# Patient Record
Sex: Female | Born: 1940 | Race: White | Hispanic: No | Marital: Married | Smoking: Never smoker
Health system: Southern US, Community
[De-identification: ages and names within clinical notes are randomized; demographics above are authoritative.]

## PROBLEM LIST (undated history)

## (undated) DIAGNOSIS — I890 Lymphedema, not elsewhere classified: Secondary | ICD-10-CM

## (undated) DIAGNOSIS — C50919 Malignant neoplasm of unspecified site of unspecified female breast: Secondary | ICD-10-CM

## (undated) HISTORY — PX: ABDOMINAL HYSTERECTOMY: SHX81

## (undated) HISTORY — PX: MASTECTOMY: SHX3

---

## 2015-05-06 ENCOUNTER — Encounter (HOSPITAL_COMMUNITY): Payer: Self-pay

## 2015-05-06 ENCOUNTER — Emergency Department (HOSPITAL_COMMUNITY): Payer: Medicare Other

## 2015-05-06 ENCOUNTER — Inpatient Hospital Stay (HOSPITAL_COMMUNITY)
Admission: EM | Admit: 2015-05-06 | Discharge: 2015-05-10 | DRG: 563 | Disposition: A | Discharge status: Home / Self Care | Payer: Medicare Other | Attending: Orthopedic Surgery | Admitting: Orthopedic Surgery

## 2015-05-06 ENCOUNTER — Observation Stay (HOSPITAL_COMMUNITY): Payer: Medicare Other

## 2015-05-06 DIAGNOSIS — S4290XA Fracture of unspecified shoulder girdle, part unspecified, initial encounter for closed fracture: Secondary | ICD-10-CM | POA: Diagnosis present

## 2015-05-06 DIAGNOSIS — S42201A Unspecified fracture of upper end of right humerus, initial encounter for closed fracture: Secondary | ICD-10-CM

## 2015-05-06 DIAGNOSIS — W1830XA Fall on same level, unspecified, initial encounter: Secondary | ICD-10-CM | POA: Diagnosis present

## 2015-05-06 DIAGNOSIS — Z01811 Encounter for preprocedural respiratory examination: Secondary | ICD-10-CM

## 2015-05-06 DIAGNOSIS — Z853 Personal history of malignant neoplasm of breast: Secondary | ICD-10-CM

## 2015-05-06 DIAGNOSIS — S42209A Unspecified fracture of upper end of unspecified humerus, initial encounter for closed fracture: Secondary | ICD-10-CM | POA: Diagnosis present

## 2015-05-06 DIAGNOSIS — Z9010 Acquired absence of unspecified breast and nipple: Secondary | ICD-10-CM

## 2015-05-06 DIAGNOSIS — M25521 Pain in right elbow: Secondary | ICD-10-CM

## 2015-05-06 DIAGNOSIS — S42291A Other displaced fracture of upper end of right humerus, initial encounter for closed fracture: Secondary | ICD-10-CM | POA: Diagnosis not present

## 2015-05-06 DIAGNOSIS — M25511 Pain in right shoulder: Secondary | ICD-10-CM | POA: Diagnosis not present

## 2015-05-06 HISTORY — DX: Lymphedema, not elsewhere classified: I89.0

## 2015-05-06 HISTORY — DX: Malignant neoplasm of unspecified site of unspecified female breast: C50.919

## 2015-05-06 LAB — BASIC METABOLIC PANEL
ANION GAP: 11 (ref 5–15)
BUN: 13 mg/dL (ref 6–20)
CALCIUM: 7.8 mg/dL — AB (ref 8.9–10.3)
CO2: 21 mmol/L — AB (ref 22–32)
CREATININE: 0.62 mg/dL (ref 0.44–1.00)
Chloride: 103 mmol/L (ref 101–111)
GFR calc Af Amer: >60 mL/min (ref >60)
GLUCOSE: 132 mg/dL — AB (ref 65–99)
Potassium: 3.3 mmol/L — ABNORMAL LOW (ref 3.5–5.1)
Sodium: 135 mmol/L (ref 135–145)

## 2015-05-06 LAB — CBC WITH DIFFERENTIAL/PLATELET
BASOS ABS: 0 10*3/uL (ref 0.0–0.1)
BASOS PCT: 0 %
EOS ABS: 0 10*3/uL (ref 0.0–0.7)
EOS PCT: 0 %
HCT: 36.2 % (ref 36.0–46.0)
Hemoglobin: 11.8 g/dL — ABNORMAL LOW (ref 12.0–15.0)
Lymphocytes Relative: 7 %
Lymphs Abs: 1 10*3/uL (ref 0.7–4.0)
MCH: 30.1 pg (ref 26.0–34.0)
MCHC: 32.6 g/dL (ref 30.0–36.0)
MCV: 92.3 fL (ref 78.0–100.0)
MONO ABS: 0.9 10*3/uL (ref 0.1–1.0)
MONOS PCT: 6 %
Neutro Abs: 12.9 10*3/uL — ABNORMAL HIGH (ref 1.7–7.7)
Neutrophils Relative %: 87 %
PLATELETS: 200 10*3/uL (ref 150–400)
RBC: 3.92 MIL/uL (ref 3.87–5.11)
RDW: 13 % (ref 11.5–15.5)
WBC: 14.9 10*3/uL — ABNORMAL HIGH (ref 4.0–10.5)

## 2015-05-06 MED ORDER — ZOLPIDEM TARTRATE 10 MG PO TABS: 10.0000 mg | ORAL_TABLET | Freq: Every evening | ORAL | Status: DC | PRN

## 2015-05-06 MED ORDER — METHOCARBAMOL 500 MG PO TABS
500.0000 mg | ORAL_TABLET | Freq: Four times a day (QID) | ORAL | Status: DC | PRN
  Administered 2015-05-06 – 2015-05-10 (×12): 500 mg via ORAL
  Filled 2015-05-06 (×12): qty 1

## 2015-05-06 MED ORDER — ONDANSETRON 4 MG PO TBDP
4.0000 mg | ORAL_TABLET | Freq: Four times a day (QID) | ORAL | Status: DC | PRN
Start: 2015-05-06 — End: 2015-05-10

## 2015-05-06 MED ORDER — DIAZEPAM 2 MG PO TABS
2.0000 mg | ORAL_TABLET | Freq: Once | ORAL | Status: AC
  Administered 2015-05-06: 2 mg via ORAL
  Filled 2015-05-06: qty 1

## 2015-05-06 MED ORDER — ONDANSETRON HCL 4 MG/2ML IJ SOLN
4.0000 mg | Freq: Four times a day (QID) | INTRAMUSCULAR | Status: DC | PRN
  Administered 2015-05-06 – 2015-05-07 (×4): 4 mg via INTRAVENOUS
  Filled 2015-05-06 (×4): qty 2

## 2015-05-06 MED ORDER — HYDROMORPHONE HCL 1 MG/ML IJ SOLN
1.0000 mg | Freq: Once | INTRAMUSCULAR | Status: AC
Start: 2015-05-06 — End: 2015-05-06
  Administered 2015-05-06: 1 mg via INTRAVENOUS
  Filled 2015-05-06: qty 1

## 2015-05-06 MED ORDER — ONDANSETRON HCL 4 MG/2ML IJ SOLN
4.0000 mg | Freq: Once | INTRAMUSCULAR | Status: AC
  Administered 2015-05-06: 4 mg via INTRAVENOUS
  Filled 2015-05-06: qty 2

## 2015-05-06 MED ORDER — POLYETHYLENE GLYCOL 3350 17 G PO PACK
17.0000 g | PACK | Freq: Every day | ORAL | Status: DC | PRN
  Administered 2015-05-07 – 2015-05-10 (×4): 17 g via ORAL
  Filled 2015-05-06 (×5): qty 1

## 2015-05-06 MED ORDER — HYDROMORPHONE HCL 1 MG/ML IJ SOLN
1.0000 mg | INTRAMUSCULAR | Status: DC | PRN
  Administered 2015-05-07 (×5): 1 mg via INTRAVENOUS
  Filled 2015-05-06 (×5): qty 1

## 2015-05-06 MED ORDER — HYDROMORPHONE HCL 1 MG/ML IJ SOLN
1.0000 mg | Freq: Once | INTRAMUSCULAR | Status: AC
  Administered 2015-05-06: 1 mg via INTRAVENOUS
  Filled 2015-05-06: qty 1

## 2015-05-06 NOTE — ED Notes (Signed)
EMS gave 8mg  of zofran and 126mcg of fentanyl in route

## 2015-05-06 NOTE — ED Notes (Signed)
Pt vomited shortly after receiving pain medication

## 2015-05-06 NOTE — ED Notes (Signed)
Pt slipped on her rug in the kitchen and fell on her right arm, no LOC, she only complains of right arm pain, pt has lymphedema in that arm, no complaints of neck or back pain.

## 2015-05-06 NOTE — ED Notes (Signed)
Bed: [id number] Expected date:  Expected time:  Means of arrival:  Comments: Ems-fall 61f

## 2015-05-06 NOTE — ED Provider Notes (Signed)
CSN: [id number]     Arrival date & time 05/06/15  1918 History   First MD Initiated Contact with Patient 05/06/15 1935     Chief Complaint  Patient presents with  . Fall  . Arm Pain     (Consider location/radiation/quality/duration/timing/severity/associated sxs/prior Treatment) Patient is a 75 y.o. female presenting with fall and arm pain. The history is provided by the patient.  Fall Pertinent negatives include no chest pain, no abdominal pain, no headaches and no shortness of breath.  Arm Pain This is a new problem. The current episode started less than 1 hour ago. The problem occurs constantly. The problem has not changed since onset.Pertinent negatives include no chest pain, no abdominal pain, no headaches and no shortness of breath. Nothing aggravates the symptoms. Nothing relieves the symptoms. She has tried nothing for the symptoms. The treatment provided no relief.   75 yo F with a chief complaints of right arm pain. The patient was walking and tripped over some bedroom slippers and landed on her outstretched right arm. Pain to the upper arm. Patient denies head injury denies loss consciousness. States it was so painful that she had trouble getting off the ground. Denies other injury.   Past Medical History  Diagnosis Date  . Lymphedema   . Breast cancer [facility])    Past Surgical History  Procedure Laterality Date  . Mastectomy    . Abdominal hysterectomy     History reviewed. No pertinent family history. Social History  Substance Use Topics  . Smoking status: Never Smoker   . Smokeless tobacco: None  . Alcohol Use: No   OB History    No data available     Review of Systems  Constitutional: Negative for fever and chills.  HENT: Negative for congestion and rhinorrhea.   Eyes: Negative for redness and visual disturbance.  Respiratory: Negative for shortness of breath and wheezing.   Cardiovascular: Negative for chest pain and palpitations.  Gastrointestinal:  Negative for nausea, vomiting and abdominal pain.  Genitourinary: Negative for dysuria and urgency.  Musculoskeletal: Positive for myalgias and arthralgias.  Skin: Negative for pallor and wound.  Neurological: Negative for dizziness and headaches.      Allergies  Codeine; Levaquin; and Percocet  Home Medications   Prior to Admission medications   Medication Sig Start Date End Date Taking? Authorizing Provider  calcium carbonate 1250 MG capsule Take 1,250 mg by mouth daily.   Yes Historical Provider, MD  cholecalciferol (VITAMIN D) 1000 units tablet Take 1,000 Units by mouth daily.   Yes Historical Provider, MD  pseudoephedrine (SUDAFED) 30 MG tablet Take 60 mg by mouth every 4 (four) hours as needed for congestion.   Yes Historical Provider, MD   BP 170/83 mmHg  Pulse 66  Temp(Src) 97.4 F (36.3 C)  Resp 18  SpO2 97% Physical Exam  Constitutional: She is oriented to person, place, and time. She appears well-developed and well-nourished. No distress.  HENT:  Head: Normocephalic and atraumatic.  Eyes: EOM are normal. Pupils are equal, round, and reactive to light.  Neck: Normal range of motion. Neck supple.  Cardiovascular: Normal rate and regular rhythm.  Exam reveals no gallop and no friction rub.   No murmur heard. Pulmonary/Chest: Effort normal. She has no wheezes. She has no rales.  Abdominal: Soft. She exhibits no distension. There is no tenderness. There is no rebound and no guarding.  Musculoskeletal: She exhibits edema and tenderness.  Tender to palpation about the medial aspect of the upper  arm. Patient with mild deformity. Pulse motor and sensation intact distally. No noted clavicular pain. No pain to the chest abdomen or pelvis. No back pain.  Neurological: She is alert and oriented to person, place, and time.  Skin: Skin is warm and dry. She is not diaphoretic.  Psychiatric: She has a normal mood and affect. Her behavior is normal.  Nursing note and vitals  reviewed.   ED Course  Procedures (including critical care time) Labs Review Labs Reviewed  BASIC METABOLIC PANEL  CBC WITH DIFFERENTIAL/PLATELET    Imaging Review Dg Shoulder Right  05/06/2015  CLINICAL DATA:  Status post fall today with a right shoulder injury. Pain. Initial encounter. EXAM: RIGHT SHOULDER - 2+ VIEW COMPARISON:  None. FINDINGS: The patient has an acute fracture of the proximal humerus extending from the humeral head and greater tuberosity in a spiral orientation through the diaphysis 11 cm below the top of the humeral head. There is no dislocation. The acromioclavicular joint is intact. Surgical clips in the axilla are noted. IMPRESSION: Head and proximal diaphyseal fracture of the right humerus as described above. Electronically Signed   By: Inge Rise M.D.   On: 05/06/2015 20:34   Dg Elbow 2 Views Right  05/06/2015  CLINICAL DATA:  Pain from biceps elbow after fall today at home EXAM: RIGHT ELBOW - 2 VIEW COMPARISON:  None. FINDINGS: AP and lateral views of the elbow limited by suboptimal positioning. No obvious fracture or dislocation. IMPRESSION: Grossly negative but suboptimal positioning causing limited evaluation. Electronically Signed   By: Skipper Cliche M.D.   On: 05/06/2015 20:34   Dg Humerus Right  05/06/2015  CLINICAL DATA:  75 year old female with right arm pain following fall today. Initial encounter. EXAM: RIGHT HUMERUS - 2+ VIEW COMPARISON:  None. FINDINGS: An oblique fracture involving the humeral head, neck and proximal diaphysis is noted with 5 mm posterior/lateral displacement. There is no evidence of subluxation or dislocation. No other bony abnormalities are present. IMPRESSION: Oblique fracture involving the humeral head, neck and proximal diaphysis with 5 mm displacement. Electronically Signed   By: Margarette Canada M.D.   On: 05/06/2015 20:35   I have personally reviewed and evaluated these images and lab results as part of my medical  decision-making.   EKG Interpretation None      MDM   Final diagnoses:  Fracture, humerus, proximal, right, closed, initial encounter    75 yo F with a chief complaint of right arm pain. Patient with deformity to the humeral area. Suspect humeral fracture. We'll obtain plain films.  Patient found to have a proximal humerus fracture with significant displacement. This is discussed with orthopedics they'll admit her for pain control.  The patients results and plan were reviewed and discussed.   Any x-rays performed were independently reviewed by myself.   Differential diagnosis were considered with the presenting HPI.  Medications  HYDROmorphone (DILAUDID) injection 1 mg (not administered)  methocarbamol (ROBAXIN) tablet 500 mg (not administered)  polyethylene glycol (MIRALAX / GLYCOLAX) packet 17 g (not administered)  ondansetron (ZOFRAN-ODT) disintegrating tablet 4 mg (not administered)    Or  ondansetron (ZOFRAN) injection 4 mg (not administered)  HYDROmorphone (DILAUDID) injection 1 mg (1 mg Intravenous Given 05/06/15 1951)  ondansetron (ZOFRAN) injection 4 mg (4 mg Intravenous Given 05/06/15 1952)  HYDROmorphone (DILAUDID) injection 1 mg (1 mg Intravenous Given 05/06/15 2104)  diazepam (VALIUM) tablet 2 mg (2 mg Oral Given 05/06/15 2104)    Filed Vitals:   05/06/15 1932 05/06/15  2137  BP: 161/68 170/83  Pulse: 62 66  Temp: 97.4 F (36.3 C)   Resp: 18 18  SpO2: 97% 97%    Final diagnoses:  Fracture, humerus, proximal, right, closed, initial encounter    Admission/ observation were discussed with the admitting physician, patient and/or family and they are comfortable with the plan.    Deno Etienne, DO 05/06/15 2202

## 2015-05-06 NOTE — H&P (Signed)
Christina Haney is an 75 y.o. female.    Chief Complaint: right shoulder pain  HPI:  75 y/o female with a ground level fall tonight injuring right shoulder/arm. C/o severe pain to right shoulder and arm. Denies any other injuries. No LOC, chest pain or syncope. Denies any previous problems with right shoulder in the past. Denies any numbness or tingling distally. Otherwise doing fair  PCP:  No primary care provider on file.  PMH: Past Medical History  Diagnosis Date  . Lymphedema   . Breast cancer (HCC)     PSH: Past Surgical History  Procedure Laterality Date  . Mastectomy    . Abdominal hysterectomy      Social History:  reports that she has never smoked. She does not have any smokeless tobacco history on file. She reports that she does not drink alcohol. Her drug history is not on file.  Allergies:  Allergies  Allergen Reactions  . Codeine Nausea And Vomiting  . Levaquin [Levofloxacin] Nausea And Vomiting  . Percocet [Oxycodone-Acetaminophen] Nausea And Vomiting    Medications: No current facility-administered medications for this encounter.   Current Outpatient Prescriptions  Medication Sig Dispense Refill  . calcium carbonate 1250 MG capsule Take 1,250 mg by mouth daily.    . cholecalciferol (VITAMIN D) 1000 units tablet Take 1,000 Units by mouth daily.    . pseudoephedrine (SUDAFED) 30 MG tablet Take 60 mg by mouth every 4 (four) hours as needed for congestion.      No results found for this or any previous visit (from the past 48 hour(s)). Dg Shoulder Right  05/06/2015  CLINICAL DATA:  Status post fall today with a right shoulder injury. Pain. Initial encounter. EXAM: RIGHT SHOULDER - 2+ VIEW COMPARISON:  None. FINDINGS: The patient has an acute fracture of the proximal humerus extending from the humeral head and greater tuberosity in a spiral orientation through the diaphysis 11 cm below the top of the humeral head. There is no dislocation. The acromioclavicular  joint is intact. Surgical clips in the axilla are noted. IMPRESSION: Head and proximal diaphyseal fracture of the right humerus as described above. Electronically Signed   By: Inge Rise M.D.   On: 05/06/2015 20:34   Dg Elbow 2 Views Right  05/06/2015  CLINICAL DATA:  Pain from biceps elbow after fall today at home EXAM: RIGHT ELBOW - 2 VIEW COMPARISON:  None. FINDINGS: AP and lateral views of the elbow limited by suboptimal positioning. No obvious fracture or dislocation. IMPRESSION: Grossly negative but suboptimal positioning causing limited evaluation. Electronically Signed   By: Skipper Cliche M.D.   On: 05/06/2015 20:34   Dg Humerus Right  05/06/2015  CLINICAL DATA:  75 year old female with right arm pain following fall today. Initial encounter. EXAM: RIGHT HUMERUS - 2+ VIEW COMPARISON:  None. FINDINGS: An oblique fracture involving the humeral head, neck and proximal diaphysis is noted with 5 mm posterior/lateral displacement. There is no evidence of subluxation or dislocation. No other bony abnormalities are present. IMPRESSION: Oblique fracture involving the humeral head, neck and proximal diaphysis with 5 mm displacement. Electronically Signed   By: Margarette Canada M.D.   On: 05/06/2015 20:35    ROS: ROS Here from out of town Inability to move right upper extremity currently due to pain Otherwise ROS negative  Physical Exam: Alert and appropriate 75 y/o female in moderate pain Cervical spine with full rom, no tenderness Left upper extremity full rom no tenderness Pelvis stable Bilateral lower extremities full rom,  no tenderness or deformity Right upper extremity with mild deformity, edema and inability to move the arm nv intact distally No signs of open injury Physical Exam   Assessment/Plan Assessment: right proximal humerus fracture with displacement  Plan: Plan to admit at least overnight for pain management and determine when surgery would be appropriate Pt lives 6  hours away and would not recommend travel at this point Pain management  Will review CT scan and discuss results with patient in the morning  Activity as tolerated with no use of right upper extremity

## 2015-05-07 ENCOUNTER — Observation Stay (HOSPITAL_COMMUNITY): Payer: Medicare Other

## 2015-05-07 MED ORDER — HYDROMORPHONE HCL 2 MG PO TABS
2.0000 mg | ORAL_TABLET | ORAL | Status: DC | PRN
  Administered 2015-05-07 – 2015-05-08 (×5): 2 mg via ORAL
  Filled 2015-05-07 (×6): qty 1

## 2015-05-07 MED ORDER — DIPHENHYDRAMINE HCL 50 MG/ML IJ SOLN: 25.0000 mg | Freq: Four times a day (QID) | INTRAMUSCULAR | Status: DC | PRN

## 2015-05-07 MED ORDER — ZOLPIDEM TARTRATE 5 MG PO TABS
5.0000 mg | ORAL_TABLET | Freq: Every evening | ORAL | Status: DC | PRN
  Administered 2015-05-07 – 2015-05-09 (×4): 5 mg via ORAL
  Filled 2015-05-07 (×4): qty 1

## 2015-05-07 MED ORDER — DIPHENHYDRAMINE HCL 25 MG PO CAPS
25.0000 mg | ORAL_CAPSULE | ORAL | Status: DC | PRN
  Administered 2015-05-07 – 2015-05-08 (×5): 25 mg via ORAL
  Filled 2015-05-07 (×5): qty 1

## 2015-05-07 MED ORDER — ONDANSETRON HCL 4 MG/2ML IJ SOLN
4.0000 mg | Freq: Four times a day (QID) | INTRAMUSCULAR | Status: DC | PRN
Start: 2015-05-07 — End: 2015-05-07

## 2015-05-08 MED ORDER — HYDROMORPHONE HCL 2 MG PO TABS
2.0000 mg | ORAL_TABLET | ORAL | Status: DC | PRN
  Administered 2015-05-08 – 2015-05-10 (×19): 2 mg via ORAL
  Filled 2015-05-08 (×18): qty 1

## 2015-05-08 NOTE — Care Management Obs Status (Signed)
[place] NOTIFICATION   Patient Details  Name: Christina Haney MRN: [id number] Date of Birth: February 15, 1941   Medicare Observation Status Notification Given:  Yes    Guadalupe Maple, RN 05/08/2015, 2:39 PM

## 2015-05-08 NOTE — Progress Notes (Addendum)
Orthopedics Progress Note  Subjective: Patient reports improvement on itching from the oral meds  Objective:  Filed Vitals:   05/07/15 2146 05/08/15 0556  BP: 147/58 144/74  Pulse: 80 82  Temp: 98.3 F (36.8 C) 98.5 F (36.9 C)  Resp: 16 16    General: Awake and alert  Musculoskeletal: right shoulder in sling and still painful with movement Neurovascularly intact  Lab Results  Component Value Date   WBC 14.9* 05/06/2015   HGB 11.8* 05/06/2015   HCT 36.2 05/06/2015   MCV 92.3 05/06/2015   PLT 200 05/06/2015       Component Value Date/Time   NA 135 05/06/2015 2201   K 3.3* 05/06/2015 2201   CL 103 05/06/2015 2201   CO2 21* 05/06/2015 2201   GLUCOSE 132* 05/06/2015 2201   BUN 13 05/06/2015 2201   CREATININE 0.62 05/06/2015 2201   CALCIUM 7.8* 05/06/2015 2201   GFRNONAA >60 05/06/2015 2201   GFRAA >60 05/06/2015 2201    No results found for: INR, PROTIME  Assessment/Plan:  s/p fall with comminuted and displaced right proximal humerus fracture with acceptable aligment Planning discharge once patient comfortable and able to assist with self care.  She is from the [facility] area and will need follow up in [facility] DVT prophylaxis  Doran Heater. Veverly Fells, MD 05/08/2015 7:24 AM  Patient seen yesterday January 25, at 11:45 am Noted to have significant pain with mobilization and some itching with the IV medicines. Oral Dilaudid ordered  CXR noting no acute process and xrays and CT of the right proximal humerus show an extensively comminuted proximal humerus fracture with shaft extension.  Discussed with the patient and her husband that as long as the fracture alignment remained as it is right now, that non operative care should result in a good outcome.  Will need serial xrays to make sure that the greater tuberosity does not displace any more or the head fall into more valgus.   Netta Cedars MD

## 2015-05-08 NOTE — Care Management Note (Signed)
Case Management Note  Patient Details  Name: Christina Haney MRN: [id number] Date of Birth: 03-17-1941  Subjective/Objective:       Admitted s/p fall with humerus fracture, needing pain control             Action/Plan: Discharge planning, spoke with patient and spouse at bedside. Patient is from [facility], [place] and here visiting family. States she is undecided whether to stay here for a few days/week or return to [facility]. Discussed limitations of transportation. Patient unable to decide if she could make the trip or not. Patient has PCP in [facility] she will f/u with, she is concerned for lymphedema in the affect arm from previous breast cancer. Patient is ambulating independently in room, needs some assistance with getting in and out of bed, husband able to assist. Do not anticipate any d/c needs.   Expected Discharge Date:                  Expected Discharge Plan:  Home/Self Care  In-House Referral:  NA  Discharge planning Services  CM Consult  Post Acute Care Choice:  NA Choice offered to:  NA  DME Arranged:  N/A DME Agency:  NA  HH Arranged:  NA HH Agency:  NA  Status of Service:  Completed, signed off  Medicare Important Message Given:    Date Medicare IM Given:    Medicare IM give by:    Date Additional Medicare IM Given:    Additional Medicare Important Message give by:     If discussed at [place] of Stay Meetings, dates discussed:    Additional Comments:  Guadalupe Maple, RN 05/08/2015, 2:33 PM

## 2015-05-09 DIAGNOSIS — Z853 Personal history of malignant neoplasm of breast: Secondary | ICD-10-CM | POA: Diagnosis not present

## 2015-05-09 DIAGNOSIS — S4290XA Fracture of unspecified shoulder girdle, part unspecified, initial encounter for closed fracture: Secondary | ICD-10-CM | POA: Diagnosis present

## 2015-05-09 DIAGNOSIS — M25511 Pain in right shoulder: Secondary | ICD-10-CM | POA: Diagnosis present

## 2015-05-09 DIAGNOSIS — W1830XA Fall on same level, unspecified, initial encounter: Secondary | ICD-10-CM | POA: Diagnosis present

## 2015-05-09 DIAGNOSIS — S42291A Other displaced fracture of upper end of right humerus, initial encounter for closed fracture: Secondary | ICD-10-CM | POA: Diagnosis present

## 2015-05-09 DIAGNOSIS — Z9010 Acquired absence of unspecified breast and nipple: Secondary | ICD-10-CM | POA: Diagnosis not present

## 2015-05-09 NOTE — Discharge Instructions (Signed)
Strict NWB with the Right UE.  Keep sling on and the arm to your side at all times.    Ice or heat for comfort to the right shoulder.  Follow up this Thursday with Dr Veverly Fells at [place] [phone]

## 2015-05-09 NOTE — Progress Notes (Signed)
Orthopedics Progress Note  Subjective: Patient doing some better this evening Objective:  Filed Vitals:   05/09/15 0537 05/09/15 1400  BP: 138/66 113/52  Pulse: 82 89  Temp: 98.1 F (36.7 C) 98.1 F (36.7 C)  Resp: 16 16    General: Awake and alert  Musculoskeletal: right shoulder immobilized in shoulder sling, wiggles fingers Neurovascularly intact  Lab Results  Component Value Date   WBC 14.9* 05/06/2015   HGB 11.8* 05/06/2015   HCT 36.2 05/06/2015   MCV 92.3 05/06/2015   PLT 200 05/06/2015       Component Value Date/Time   NA 135 05/06/2015 2201   K 3.3* 05/06/2015 2201   CL 103 05/06/2015 2201   CO2 21* 05/06/2015 2201   GLUCOSE 132* 05/06/2015 2201   BUN 13 05/06/2015 2201   CREATININE 0.62 05/06/2015 2201   CALCIUM 7.8* 05/06/2015 2201   GFRNONAA >60 05/06/2015 2201   GFRAA >60 05/06/2015 2201    No results found for: INR, PROTIME  Assessment/Plan: s/p fall with comminuted and displaced right proximal humerus fracture Pt, OT Case Management for home DME Patient from the [place] to follow up with Dr Marlou Sa in her area after a week or so in the [place] area.    Doran Heater. Veverly Fells, MD 05/09/2015 5:23 PM

## 2015-05-10 MED ORDER — BISACODYL 10 MG RE SUPP
10.0000 mg | Freq: Once | RECTAL | Status: AC
  Administered 2015-05-10: 10 mg via RECTAL
  Filled 2015-05-10: qty 1

## 2015-05-10 MED ORDER — METHOCARBAMOL 500 MG PO TABS: 500.0000 mg | ORAL_TABLET | Freq: Four times a day (QID) | ORAL | Status: AC | PRN

## 2015-05-10 MED ORDER — HYDROMORPHONE HCL 2 MG PO TABS: 2.0000 mg | ORAL_TABLET | ORAL | Status: AC | PRN

## 2015-05-10 NOTE — Progress Notes (Signed)
Assessment unchanged. Pt and husband verbalized understanding of dc instructions through teach regarding follow up care with Dr. Veverly Fells with in the next week and when to call doctor if needed before then. Scripts given as provided by MD. Pt saw PT and OT prior to dc home. Sling in place to RT arm and pt verbalizes understanding to not  remove nor change position of arm. Discharged via wc to front entrance to meet awaiting vehicle to carry home. Accompanied by husband and NT.

## 2015-05-10 NOTE — Care Management Important Message (Deleted)
Important Message  Patient Details  Name: Christina Haney MRN: [id number] Date of Birth: Feb 08, 1941   Medicare Important Message Given:  Yes    Erenest Rasher, RN 05/10/2015, 12:18 PM

## 2015-05-10 NOTE — Care Management Note (Signed)
Case Management Note  Patient Details  Name: Christina Haney MRN: [id number] Date of Birth: 13-Feb-1941  Subjective/Objective:     Admitted s/p fall with humerus fracture, needing pain control                 Action/Plan: NCM spoke to pt and husband at bedside. No PT/OT recommended for home. Pt states she will travel back to [facility] after she follows up with Orthopedic in one week.   Expected Discharge Date:  05/10/2015              Expected Discharge Plan:  Home/Self Care  In-House Referral:  NA  Discharge planning Services  CM Consult  Post Acute Care Choice:  NA Choice offered to:  NA  DME Arranged:  N/A DME Agency:  NA  HH Arranged:  NA HH Agency:  NA  Status of Service:  Completed, signed off  Medicare Important Message Given:    Date Medicare IM Given:    Medicare IM give by:    Date Additional Medicare IM Given:    Additional Medicare Important Message give by:     If discussed at [place] of Stay Meetings, dates discussed:    Additional Comments:  Erenest Rasher, RN 05/10/2015, 12:17 PM

## 2015-05-10 NOTE — Progress Notes (Signed)
   Subjective:     Recheck right proximal humerus fracture Pain has improved and pt moving around the room without difficulty Plan for d/c home today with home therapy Denies any new symptoms or issues  Patient reports pain as moderate.  Objective:   VITALS:   Filed Vitals:   05/09/15 2117 05/10/15 0426  BP: 132/64 140/70  Pulse: 87 91  Temp: 98.9 F (37.2 C) 98.1 F (36.7 C)  Resp: 16 16    Right upper extremity in sling nv intact distally No exam today due to known fx  LABS No results for input(s): HGB, HCT, WBC, PLT in the last 72 hours.  No results for input(s): NA, K, BUN, CREATININE, GLUCOSE in the last 72 hours.   Assessment/Plan:    Right proximal humerus fracture Plan to d/c home today with pain medications F/u with local orthopedic in 2 weeks in [facility] Call our office for any other issues while in town     [place], [place], PA-C  05/10/2015, 7:56 AM

## 2015-05-10 NOTE — Evaluation (Signed)
Physical Therapy Evaluation-1x Patient Details Name: Christina Haney MRN: [id number] DOB: July 26, 1940 Today's Date: 05/10/2015   History of Present Illness  75 yo female admitted after having fall at son's home. Sustained R humerus fx. Plan per ortho-non operative/immobilization with sling.   Clinical Impression  On eval, pt was Min guard assist for mobility-walked ~200 feet and ascended/descended 2 steps with use of rail. Slight unsteadiness and anxiety noted. Encouraged pt to exercise caution when mobilizing (take her time/no rushing, assist/supervision when climbing steps, etc). Pt tolerated activity fairly well. Discussed use of cane in community (pt could pick one up at local drugstore/medial supply store if she decides to). Do not anticipate any follow up PT needs. 1x eval. Will sign off.     Follow Up Recommendations No PT follow up    Equipment Recommendations  None recommended by PT (pt may consider purchasing cane for community ambulation)    Recommendations for Other Services OT consult     Precautions / Restrictions Precautions Precautions: Fall Required Braces or Orthoses: Sling Restrictions Weight Bearing Restrictions: Yes RLE Weight Bearing: Non weight bearing      Mobility  Bed Mobility               General bed mobility comments: pt OOB pacing around room  Transfers                 General transfer comment: pt up oob pacing around room  Ambulation/Gait   Ambulation Distance (Feet): 200 Feet Assistive device: None Gait Pattern/deviations: Step-through pattern;Decreased stride length;Drifts right/left     General Gait Details: close guard for safety. Slightly unsteady at times but no LOB. Intermittent "furniture walking" noted  Stairs Stairs: Yes Stairs assistance: Min guard Stair Management: Step to pattern;Forwards Number of Stairs: 2 General stair comments: close guard for safety. VCs safety, technique.   Wheelchair Mobility     Modified Rankin (Stroke Patients Only)       Balance Overall balance assessment: Needs assistance         Standing balance support: During functional activity Standing balance-Leahy Scale: Good                               Pertinent Vitals/Pain Pain Assessment: 0-10 Pain Score: 5  Pain Location: R UE with some movements Pain Descriptors / Indicators: Sore Pain Intervention(s): Monitored during session    Home Living Family/patient expects to be discharged to:: Private residence (son's home) Living Arrangements: Spouse/significant other Available Help at Discharge: Family Type of Home: House Home Access: Stairs to enter   Technical brewer of Steps: 2   Home Equipment: None Additional Comments: Pt is visiting from [place]-plans to stay at son's home for a few days until follow up ortho appt    Prior Function Level of Independence: Independent               Hand Dominance        Extremity/Trunk Assessment   Upper Extremity Assessment: Defer to OT evaluation           Lower Extremity Assessment: Generalized weakness      Cervical / Trunk Assessment: Normal  Communication   Communication: No difficulties  Cognition Arousal/Alertness: Awake/alert Behavior During Therapy: WFL for tasks assessed/performed Overall Cognitive Status: Within Functional Limits for tasks assessed                      General Comments  Exercises        Assessment/Plan    PT Assessment Patent does not need any further PT services  PT Diagnosis     PT Problem List    PT Treatment Interventions     PT Goals (Current goals can be found in the Care Plan section) Acute Rehab PT Goals Patient Stated Goal: no more falls.  PT Goal Formulation: All assessment and education complete, DC therapy    Frequency     Barriers to discharge        Co-evaluation               End of Session   Activity Tolerance: Patient tolerated  treatment well Patient left: with call bell/phone within reach;with family/visitor present           Time: [id number] PT Time Calculation (min) (ACUTE ONLY): 17 min   Charges:   PT Evaluation $PT Eval Moderate Complexity: 1 Procedure     PT G Codes:        Weston Anna, MPT Pager: [phone]

## 2015-05-10 NOTE — Evaluation (Signed)
Occupational Therapy Evaluation Patient Details Name: Christina Haney MRN: [id number] DOB: Aug 02, 1940 Today's Date: 05/10/2015    History of Present Illness 75 yo female admitted after having fall at son's home. Sustained R humerus fx. Plan per ortho-non operative/immobilization with sling.    Clinical Impression   Pt admitted with R humerus fracture. Pt currently with functional limitations due to the deficits listed below (see OT Problem List).  Pt will benefit from skilled OT to increase their safety and independence with ADL and functional mobility for ADL to facilitate discharge to venue listed below.      Follow Up Recommendations  Outpatient OT;Other (comment) (as MD indicates)    Equipment Recommendations  None recommended by OT;Other (comment) (son already purchased 3 n 1)       Precautions / Restrictions Precautions Precautions: Fall Required Braces or Orthoses: Sling Restrictions Weight Bearing Restrictions: Yes RLE Weight Bearing: Non weight bearing      Mobility Bed Mobility Overal bed mobility: Needs Assistance Bed Mobility: Sit to Supine       Sit to supine: Min assist   General bed mobility comments: pt OOB pacing around room  Transfers Overall transfer level: Needs assistance   Transfers: Sit to/from Stand Sit to Stand: Min assist         General transfer comment: educated pt to push up with LUE from arm of chair    Balance Overall balance assessment: Needs assistance         Standing balance support: During functional activity Standing balance-Leahy Scale: Good                              ADL Overall ADL's : Needs assistance/impaired Eating/Feeding: Minimal assistance;Sitting   Grooming: Minimal assistance;Sitting   Upper Body Bathing: Moderate assistance;Cueing for UE precautions;Sitting;Adhering to UE precautions   Lower Body Bathing: Maximal assistance;Sit to/from stand;Cueing for sequencing;Cueing for safety    Upper Body Dressing : Moderate assistance;Cueing for UE precautions;Sitting   Lower Body Dressing: Maximal assistance;Sit to/from stand;Cueing for sequencing   Toilet Transfer: Minimal assistance;BSC;Regular Toilet;Stand-pivot   Toileting- Clothing Manipulation and Hygiene: Sit to/from stand;Cueing for sequencing;Cueing for safety                         Pertinent Vitals/Pain Pain Assessment: 0-10 Pain Score: 9  Pain Location: RUE  Pain Descriptors / Indicators: Sore Pain Intervention(s): Repositioned;Monitored during session     Hand Dominance  right   Extremity/Trunk Assessment Upper Extremity Assessment Upper Extremity Assessment: RUE deficits/detail RUE Deficits / Details: RUe in sling- pt able to move wrist and hand. Per MD pt is allowed to extend elbow but pain is preventing this movement.  pt does have a history of lymphedema. Explained ROM for elbow wrist and hand important for preventing further edema RUE: Unable to fully assess due to immobilization   Lower Extremity Assessment Lower Extremity Assessment: Generalized weakness   Cervical / Trunk Assessment Cervical / Trunk Assessment: Normal   Communication Communication Communication: No difficulties   Cognition Arousal/Alertness: Awake/alert Behavior During Therapy: WFL for tasks assessed/performed Overall Cognitive Status: Within Functional Limits for tasks assessed                     General Comments    Pt needed increased time for all instruction   Exercises       Shoulder Instructions Shoulder Instructions Donning/doffing shirt without moving shoulder: Caregiver  independent with task;Patient able to independently direct caregiver;Moderate assistance Method for sponge bathing under operated UE: Caregiver independent with task;Patient able to independently direct caregiver Donning/doffing sling/immobilizer: Caregiver independent with task;Patient able to independently direct  caregiver;Moderate assistance Correct positioning of sling/immobilizer: Caregiver independent with task;Patient able to independently direct caregiver;Moderate assistance ROM for elbow, wrist and digits of operated UE: Caregiver independent with task;Patient able to independently direct caregiver;Moderate assistance Sling wearing schedule (on at all times/off for ADL's): Caregiver independent with task Proper positioning of operated UE when showering: Caregiver independent with task;Patient able to independently direct caregiver;Moderate assistance Dressing change: Moderate assistance Positioning of UE while sleeping: Caregiver independent with task;Patient able to independently direct caregiver;Moderate assistance    Home Living Family/patient expects to be discharged to:: Private residence (son's home) Living Arrangements: Spouse/significant other Available Help at Discharge: Family Type of Home: House Home Access: Stairs to enter Technical brewer of Steps: 2   Home Layout: One level     Bathroom Shower/Tub: Chief Strategy Officer: None   Additional Comments: Pt is visiting from [place] to stay at son's home for a few days until follow up ortho appt      Prior Functioning/Environment Level of Independence: Independent             OT Diagnosis: Generalized weakness;Ataxia   OT Problem List: Decreased strength;Decreased range of motion;Decreased activity tolerance   OT Treatment/Interventions:      OT Goals(Current goals can be found in the care plan section) Acute Rehab OT Goals Patient Stated Goal: no more falls.   OT Frequency:                End of Session Nurse Communication: Mobility status  Activity Tolerance: Patient tolerated treatment well Patient left: in chair;with call bell/phone within reach;with family/visitor present   Time: 1000-1105 OT Time Calculation (min): 65 min Charges:  OT General Charges $OT Visit: 1  Procedure OT Evaluation $OT Eval Low Complexity: 1 Procedure OT Treatments $Self Care/Home Management : 38-52 mins G-Codes:    Payton Mccallum D 08-Jun-2015, 11:23 AM

## 2015-05-10 NOTE — Discharge Summary (Signed)
Physician Discharge Summary   Patient ID: Christina Haney MRN: [id number] DOB/AGE: [birth year] 73 y.o.  Admit date: 05/06/2015 Discharge date: 05/10/2015  Admission Diagnoses:  Active Problems:   Fracture, humerus, proximal   Fracture, shoulder   Discharge Diagnoses:  Same   Surgeries:  on * No surgery found *   Consultants: PT/OT  Discharged Condition: Stable  Hospital Course: Christina Haney is an 75 y.o. female who was admitted 05/06/2015 with a chief complaint of  Chief Complaint  Patient presents with  . Fall  . Arm Pain  , and found to have a diagnosis of <principal problem not specified>.  They were brought to the operating room on * No surgery found * and underwent the above named procedures.    The patient had an uncomplicated hospital course and was stable for discharge.  Recent vital signs:  Filed Vitals:   05/09/15 2117 05/10/15 0426  BP: 132/64 140/70  Pulse: 87 91  Temp: 98.9 F (37.2 C) 98.1 F (36.7 C)  Resp: 16 16    Recent laboratory studies:  Results for orders placed or performed during the hospital encounter of [id number]  Basic metabolic panel  Result Value Ref Range   Sodium 135 135 - 145 mmol/L   Potassium 3.3 (L) 3.5 - 5.1 mmol/L   Chloride 103 101 - 111 mmol/L   CO2 21 (L) 22 - 32 mmol/L   Glucose, Bld 132 (H) 65 - 99 mg/dL   BUN 13 6 - 20 mg/dL   Creatinine, Ser 0.62 0.44 - 1.00 mg/dL   Calcium 7.8 (L) 8.9 - 10.3 mg/dL   GFR calc non Af Amer >60 >60 mL/min   GFR calc Af Amer >60 >60 mL/min   Anion gap 11 5 - 15  CBC WITH DIFFERENTIAL  Result Value Ref Range   WBC 14.9 (H) 4.0 - 10.5 K/uL   RBC 3.92 3.87 - 5.11 MIL/uL   Hemoglobin 11.8 (L) 12.0 - 15.0 g/dL   HCT 36.2 36.0 - 46.0 %   MCV 92.3 78.0 - 100.0 fL   MCH 30.1 26.0 - 34.0 pg   MCHC 32.6 30.0 - 36.0 g/dL   RDW 13.0 11.5 - 15.5 %   Platelets 200 150 - 400 K/uL   Neutrophils Relative % 87 %   Neutro Abs 12.9 (H) 1.7 - 7.7 K/uL   Lymphocytes Relative 7 %   Lymphs Abs 1.0  0.7 - 4.0 K/uL   Monocytes Relative 6 %   Monocytes Absolute 0.9 0.1 - 1.0 K/uL   Eosinophils Relative 0 %   Eosinophils Absolute 0.0 0.0 - 0.7 K/uL   Basophils Relative 0 %   Basophils Absolute 0.0 0.0 - 0.1 K/uL    Discharge Medications:     Medication List    ASK your doctor about these medications        calcium carbonate 1250 MG capsule  Take 1,250 mg by mouth daily.     cholecalciferol 1000 units tablet  Commonly known as:  VITAMIN D  Take 1,000 Units by mouth daily.     pseudoephedrine 30 MG tablet  Commonly known as:  SUDAFED  Take 60 mg by mouth every 4 (four) hours as needed for congestion.        Diagnostic Studies: Dg Chest 2 View  05/07/2015  CLINICAL DATA:  Preop for right humerus fracture EXAM: CHEST  2 VIEW COMPARISON:  None. FINDINGS: Cardiomediastinal silhouette is unremarkable. Surgical clips are noted in right axilla. No acute infiltrate or  pulmonary edema. There is partially visualized comminuted fracture of the proximal right humerus. IMPRESSION: No active disease. Surgical clips are noted in right axilla. Partially visualized right humeral fracture. Electronically Signed   By: Lahoma Crocker M.D.   On: 05/07/2015 08:56   Dg Shoulder Right  05/06/2015  CLINICAL DATA:  Status post fall today with a right shoulder injury. Pain. Initial encounter. EXAM: RIGHT SHOULDER - 2+ VIEW COMPARISON:  None. FINDINGS: The patient has an acute fracture of the proximal humerus extending from the humeral head and greater tuberosity in a spiral orientation through the diaphysis 11 cm below the top of the humeral head. There is no dislocation. The acromioclavicular joint is intact. Surgical clips in the axilla are noted. IMPRESSION: Head and proximal diaphyseal fracture of the right humerus as described above. Electronically Signed   By: Inge Rise M.D.   On: 05/06/2015 20:34   Dg Elbow 2 Views Right  05/06/2015  CLINICAL DATA:  Pain from biceps elbow after fall today at home  EXAM: RIGHT ELBOW - 2 VIEW COMPARISON:  None. FINDINGS: AP and lateral views of the elbow limited by suboptimal positioning. No obvious fracture or dislocation. IMPRESSION: Grossly negative but suboptimal positioning causing limited evaluation. Electronically Signed   By: Skipper Cliche M.D.   On: 05/06/2015 20:34   Ct Shoulder Right Wo Contrast  05/06/2015  CLINICAL DATA:  Status post fall, with a comminuted fracture of the right humeral head. Initial encounter. EXAM: CT OF THE RIGHT SHOULDER WITHOUT CONTRAST TECHNIQUE: Multidetector CT imaging was performed according to the standard protocol. Multiplanar CT image reconstructions were also generated. COMPARISON:  Right shoulder radiographs performed earlier today at 8:09 p.m. FINDINGS: There appears to be a four-part fracture of the right humeral head and neck, with approximately 1 cm of medial and superior displacement of the lesser tuberosity fragment, approximately 1.5 cm of superior displacement of the greater tuberosity fragment, and at least 1.8 cm lateral displacement of the distal humerus. There is underlying comminution of the humeral neck fracture, with displaced fracture lines extending distally along the proximal humeral diaphysis, and associated butterfly fragments. The greater and lesser tuberosity fragments are relatively close to each other, as both are superiorly displaced. Surrounding soft tissue injury is noted, without a significant focal hematoma. The rotator cuff is not well characterized. Postoperative change is noted at the right axilla. The right acromioclavicular joint is unremarkable in appearance. The visualized portions of the right lung appear grossly clear. The articular surface of the humeral head remains seated at the glenoid fossa. The visualized right clavicle is unremarkable in appearance. IMPRESSION: Apparent 4 part fracture of the right humeral head and neck, with approximately 1 cm medial and superior displacement of the  lesser tuberosity fragment, approximately 1.5 cm of superior displacement of the greater tuberosity fragment, and at least 1.8 cm lateral displacement of the distal humerus. Underlying comminution of the humeral neck fracture, with displaced fracture lines extending distally along the proximal humeral diaphysis. The greater and lesser tuberosity fragments are relatively close to each other, as both are superiorly displaced. Electronically Signed   By: Garald Balding M.D.   On: 05/06/2015 23:07   Dg Humerus Right  05/06/2015  CLINICAL DATA:  75 year old female with right arm pain following fall today. Initial encounter. EXAM: RIGHT HUMERUS - 2+ VIEW COMPARISON:  None. FINDINGS: An oblique fracture involving the humeral head, neck and proximal diaphysis is noted with 5 mm posterior/lateral displacement. There is no evidence of subluxation or  dislocation. No other bony abnormalities are present. IMPRESSION: Oblique fracture involving the humeral head, neck and proximal diaphysis with 5 mm displacement. Electronically Signed   By: Margarette Canada M.D.   On: 05/06/2015 20:35    Disposition: Final discharge disposition not confirmed        Follow-up Information    Follow up with NORRIS,STEVEN R, MD. Call in 1 week.   Specialty:  Orthopedic Surgery   Why:  [phone]   Contact information:   [address] [place] [address] [id number]        Signed: Ventura Bruns 05/10/2015, 7:58 AM

## 2017-01-30 IMAGING — CR DG HUMERUS 2V *R*
3 series · 3 of 3 positions shown · non-contrast
Comparison: None.

CLINICAL DATA: 74-year-old female with right arm pain following
fall today. Initial encounter.

EXAM:
RIGHT HUMERUS - 2+ VIEW

[x humerus ap right]
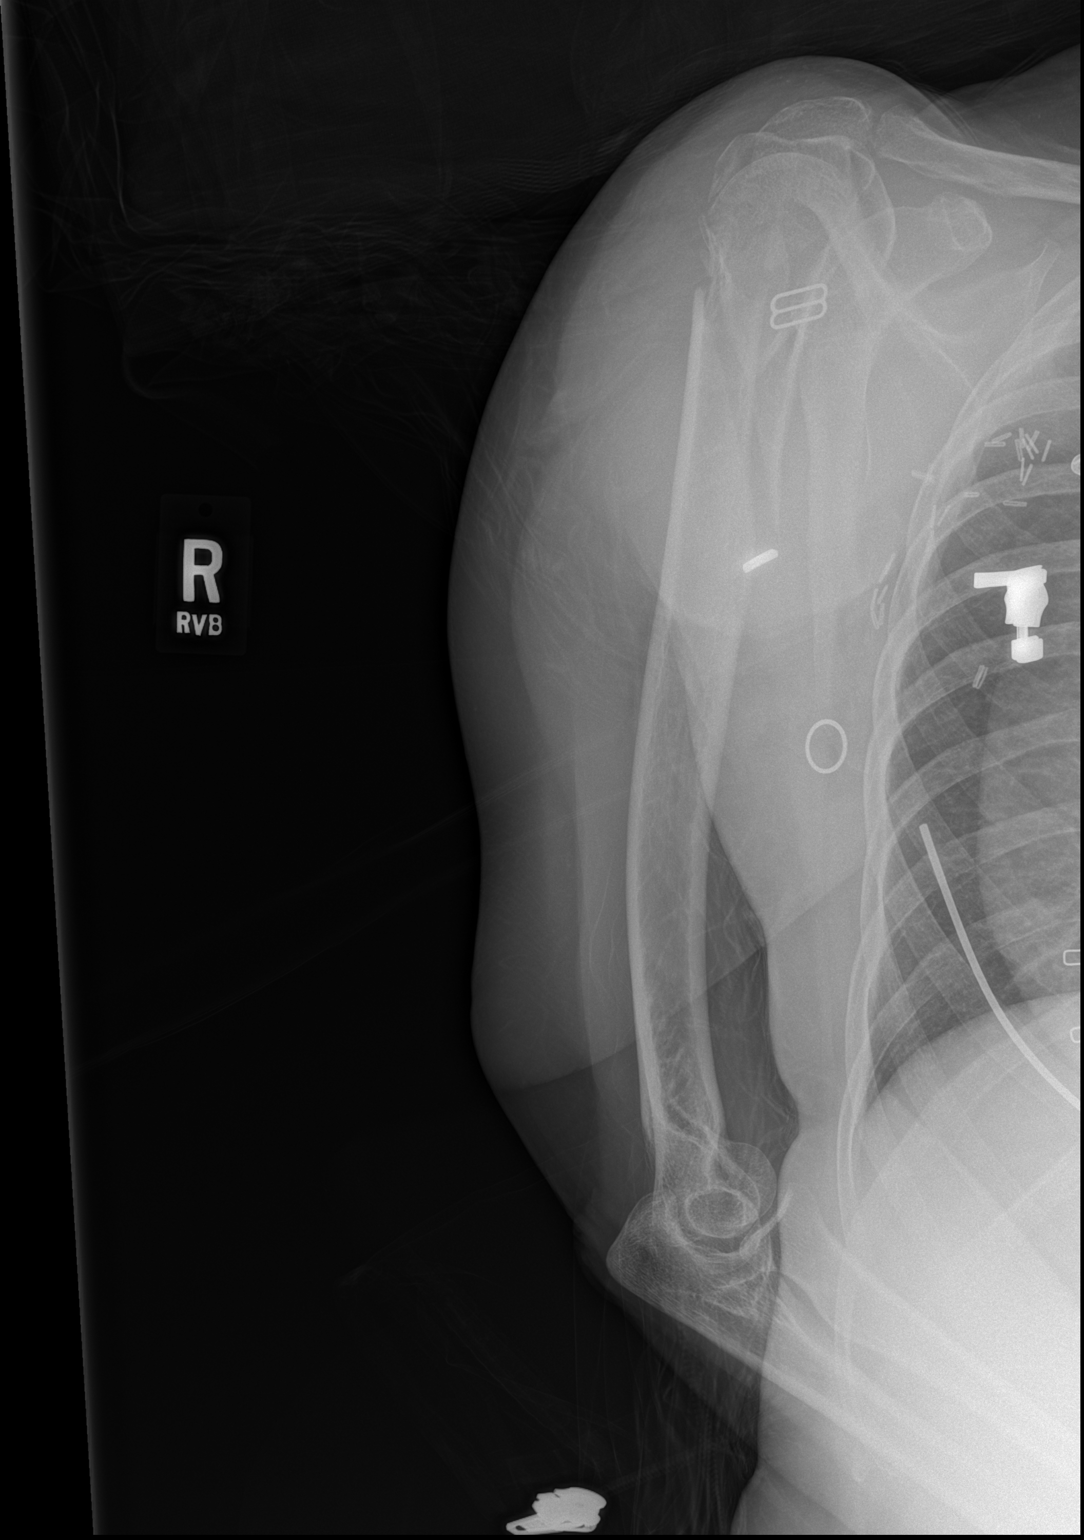

[x humerus lat right (1 of 2)]
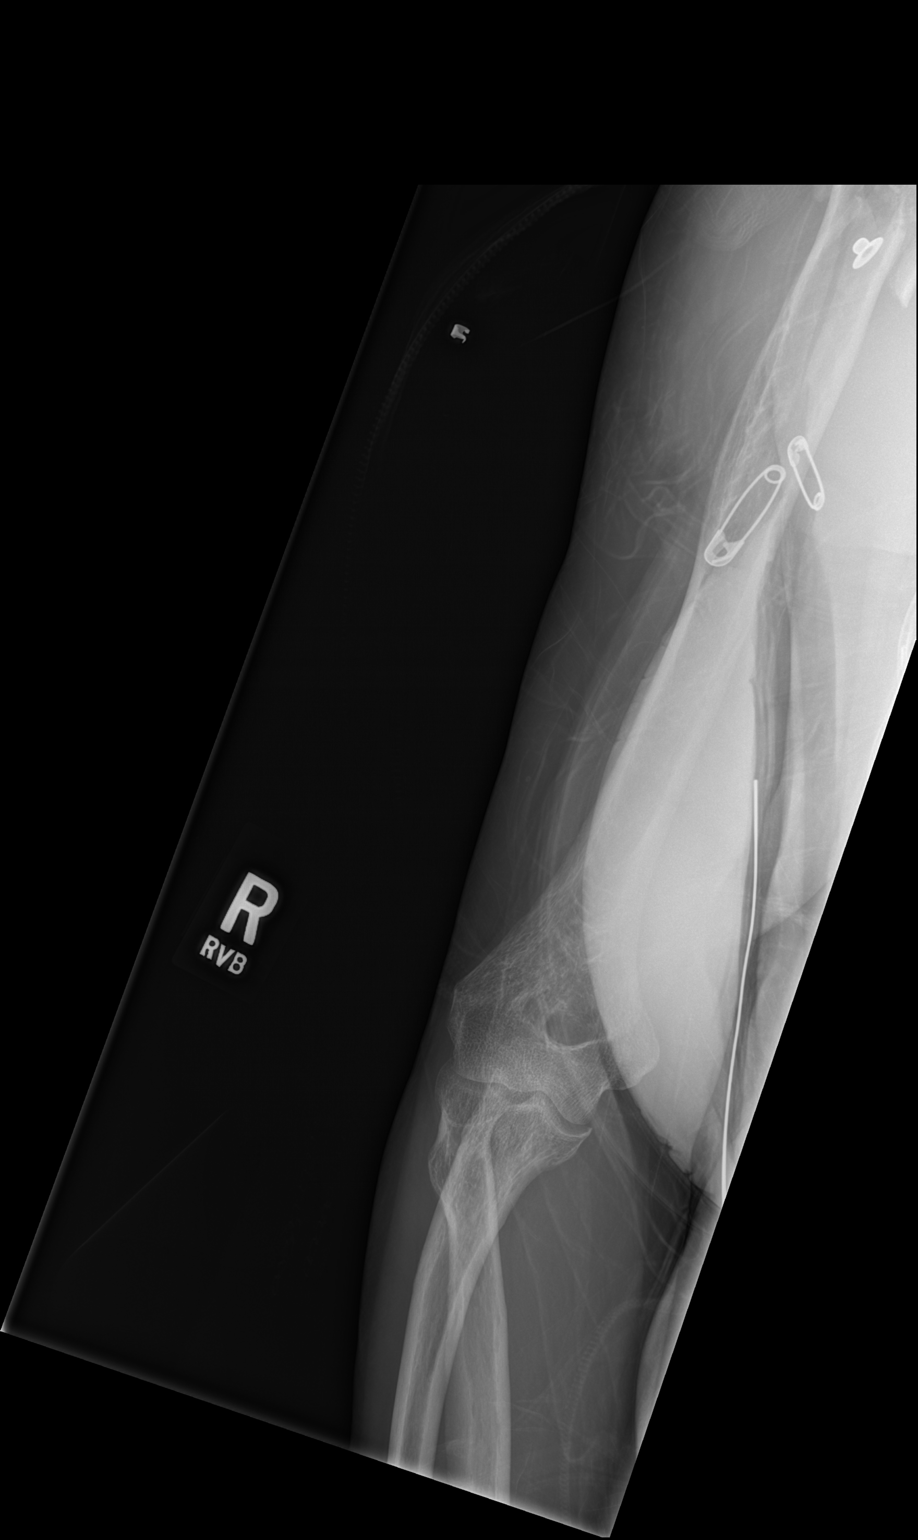

[x humerus lat right (2 of 2)]
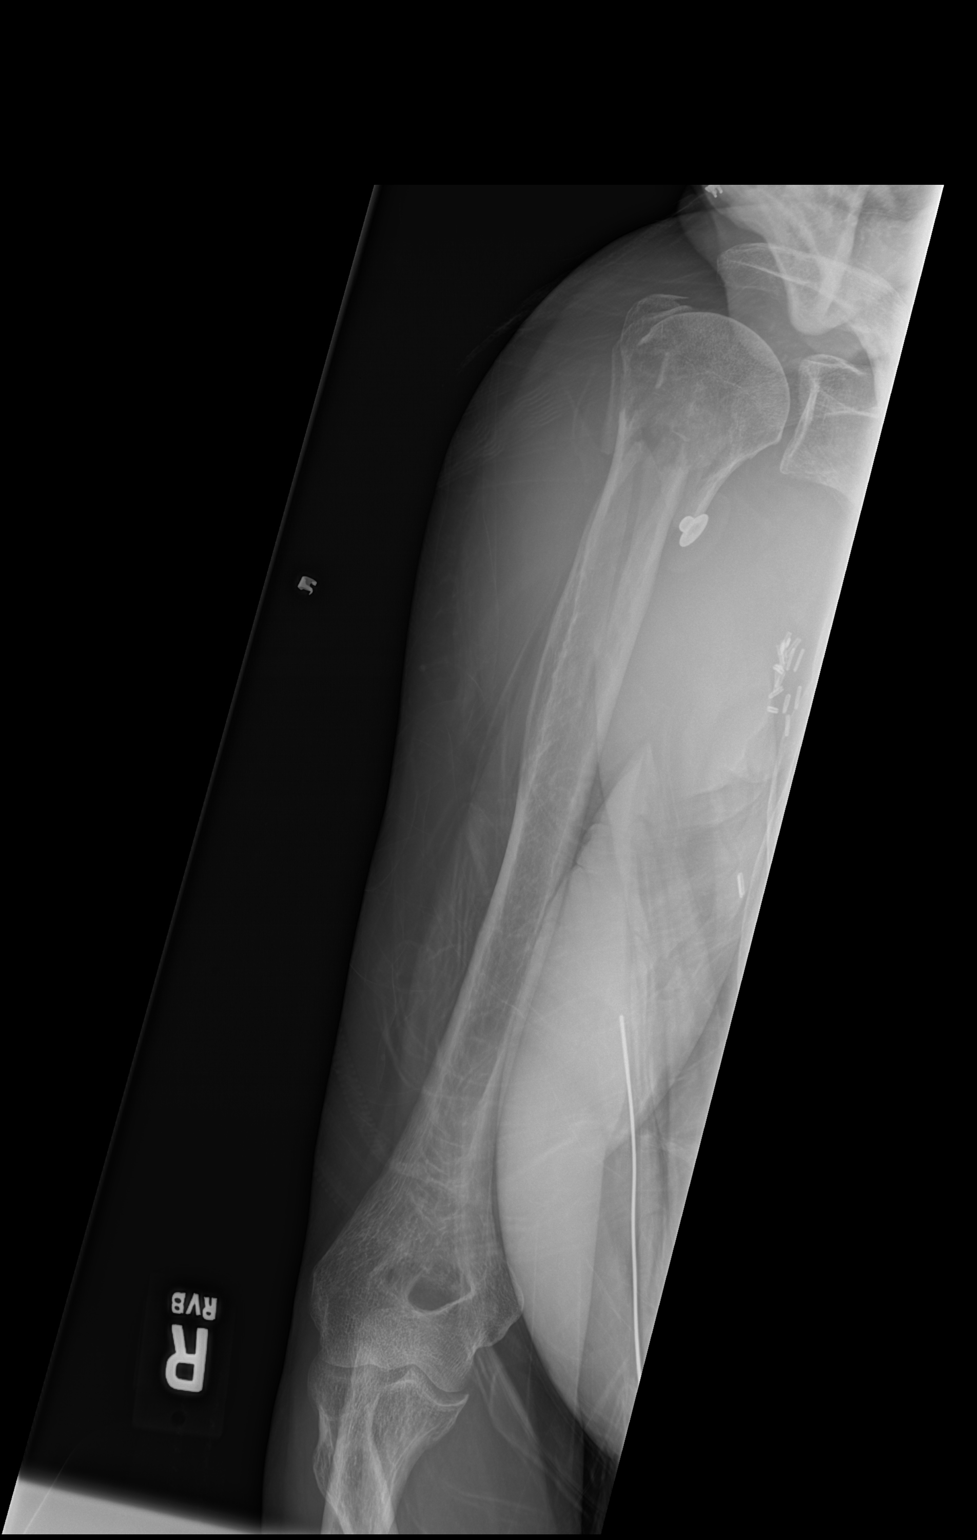

[3 of 3 positions shown; findings below may reference images not displayed]

FINDINGS: An oblique fracture involving the humeral head, neck and proximal
diaphysis is noted with 5 mm posterior/lateral displacement.

There is no evidence of subluxation or dislocation.

No other bony abnormalities are present.
IMPRESSION: Oblique fracture involving the humeral head, neck and proximal
diaphysis with 5 mm displacement.

## 2017-01-30 IMAGING — CT CT SHOULDER*R* W/O CM
4 series · 15 of 33 positions shown, 17 images · non-contrast
Comparison: Right shoulder radiographs performed earlier today at
[DATE] p.m.

CLINICAL DATA: Status post fall, with a comminuted fracture of the
right humeral head. Initial encounter.

EXAM:
CT OF THE RIGHT SHOULDER WITHOUT CONTRAST
TECHNIQUE: Multidetector CT imaging was performed according to the standard
protocol. Multiplanar CT image reconstructions were also generated.

[Series 5: shoulder st · axial · 0.39mm/px · z∈[-182,-132]mm · 2 of 51 slices shown, 3 images]
[im 17/51  soft-tissue]
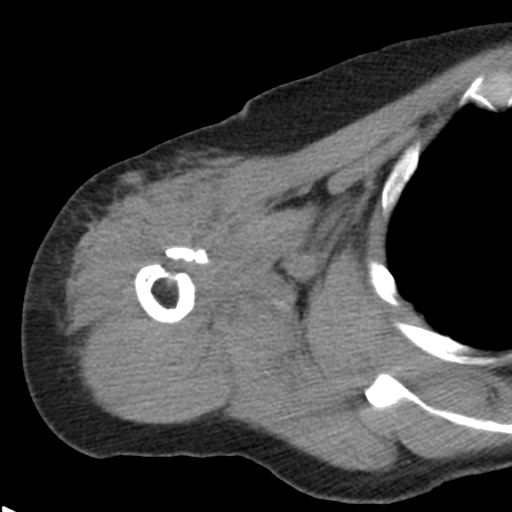
[im 17/51  bone]
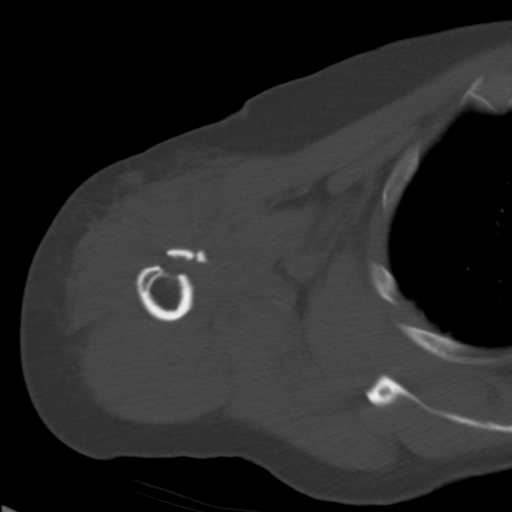
[im 34/51  bone]
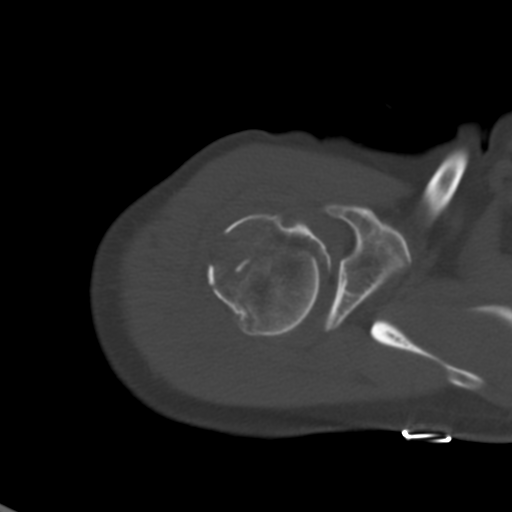

[Series 8: humerus axial · axial · 0.19mm/px · z∈[-186,-129]mm · 5 of 151 slices shown]
[im 17/151  bone]
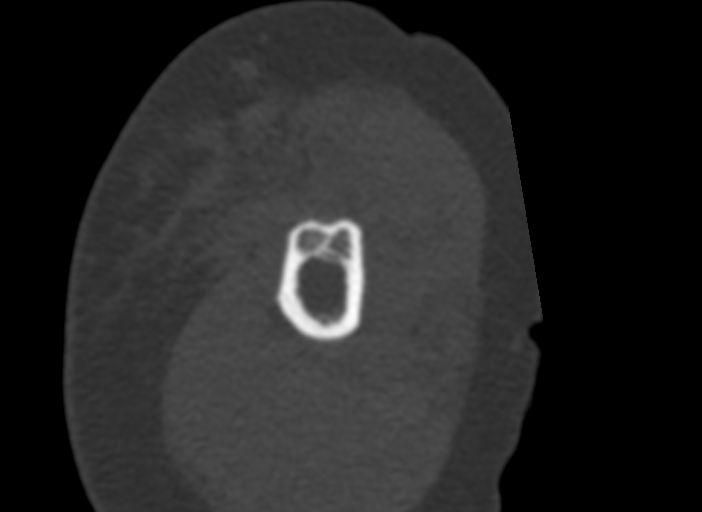
[im 34/151  bone]
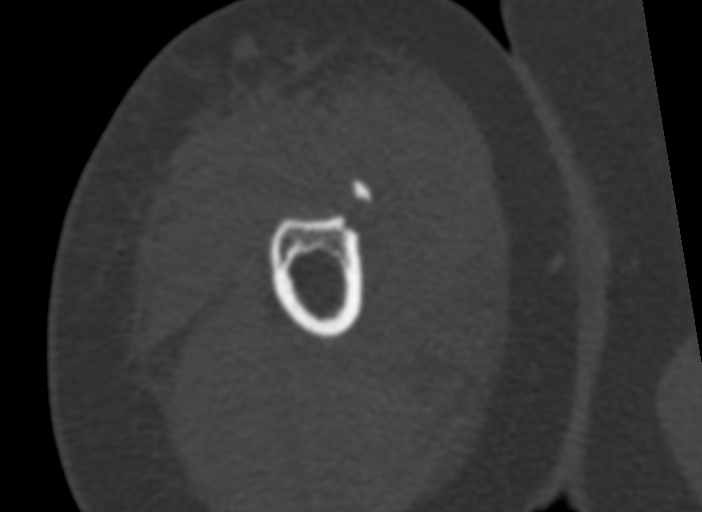
[im 51/151  bone]
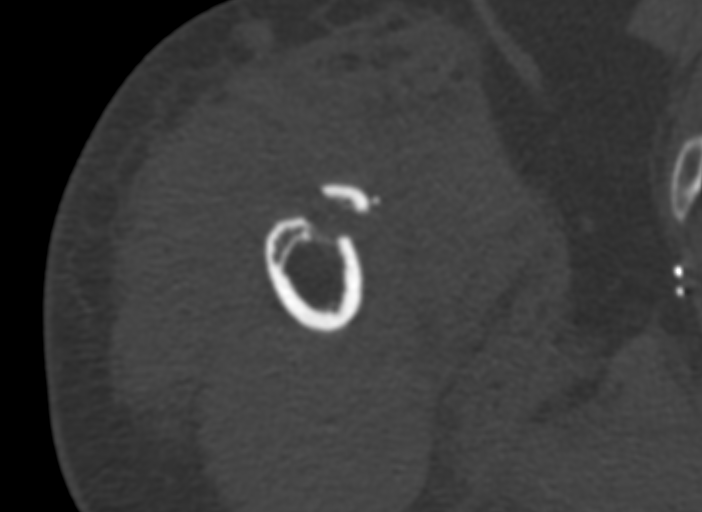
[im 67/151  bone]
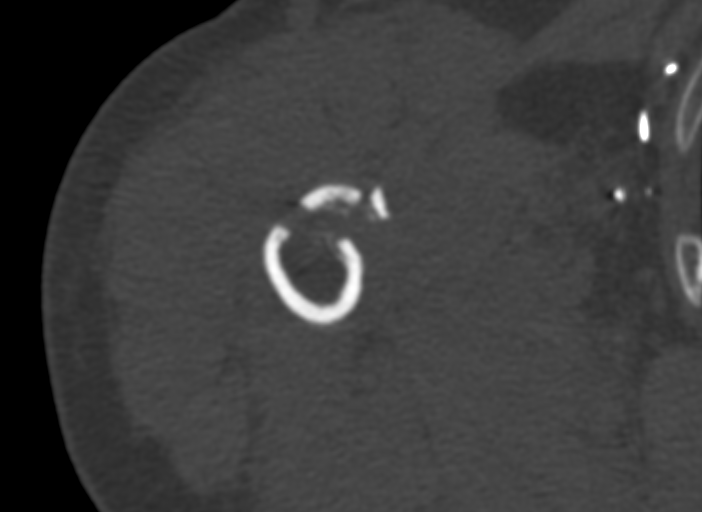
[im 84/151  bone]
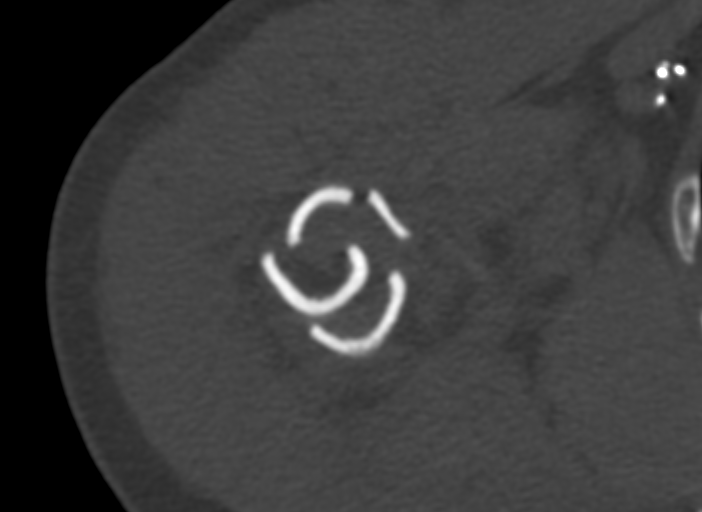

[Series 602: coronal · coronal · 0.43mm/px · 3 of 63 slices shown]
[im 13/63  bone]
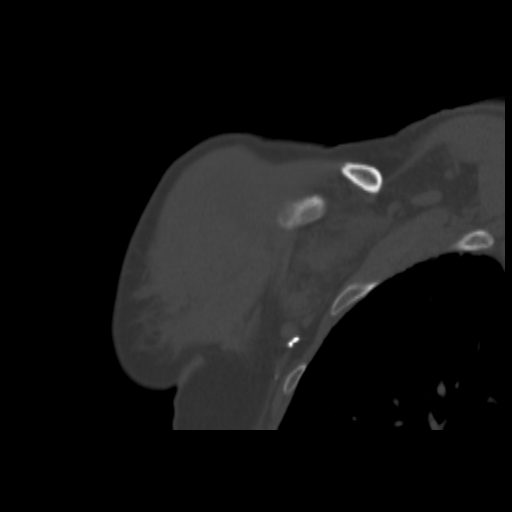
[im 25/63  bone]
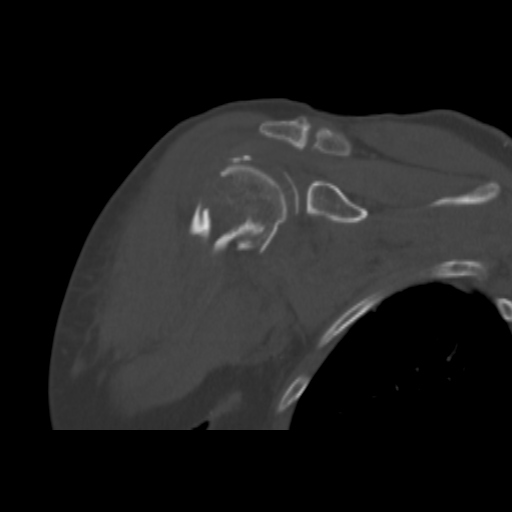
[im 38/63  bone]
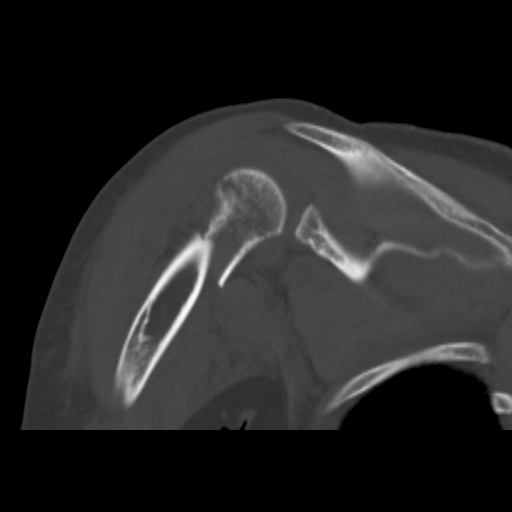

[Series 603: sagittal · sagittal · 0.43mm/px · 5 of 82 slices shown, 6 images]
[im 28/82  bone]
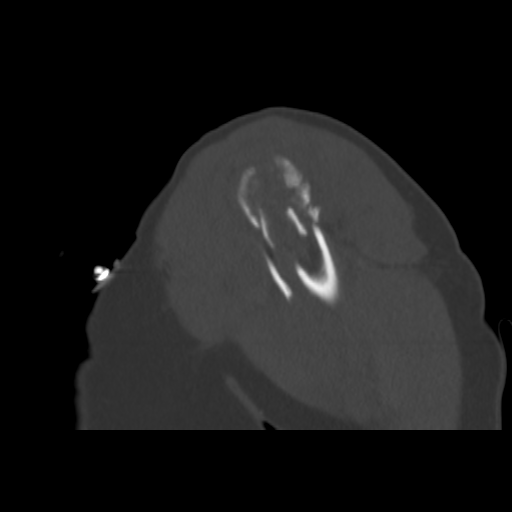
[im 34/82  bone]
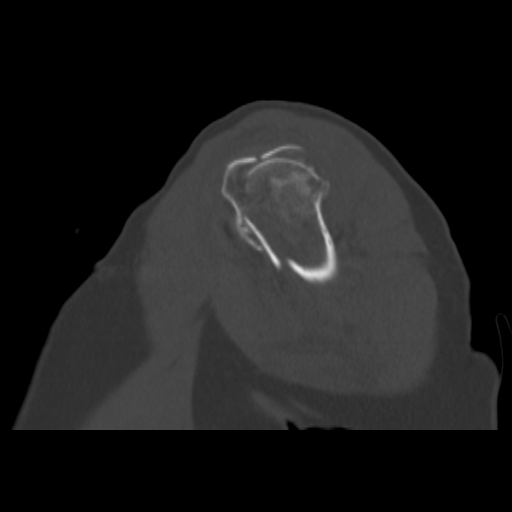
[im 41/82  soft-tissue]
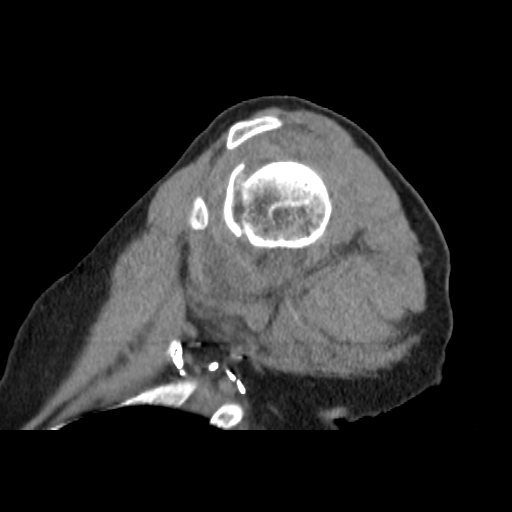
[im 41/82  bone]
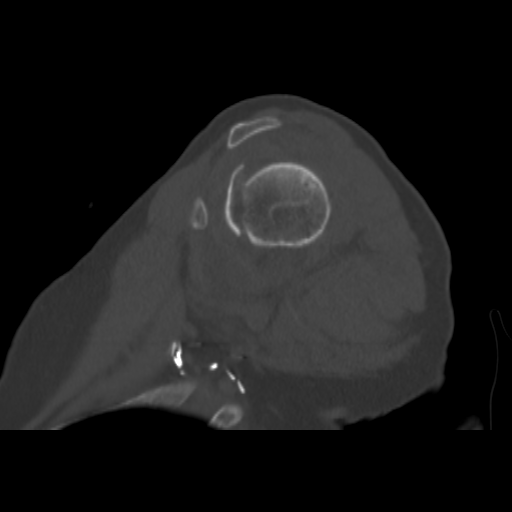
[im 48/82  bone]
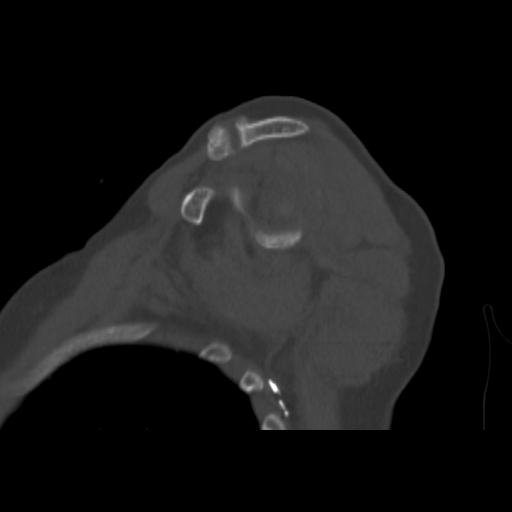
[im 55/82  bone]
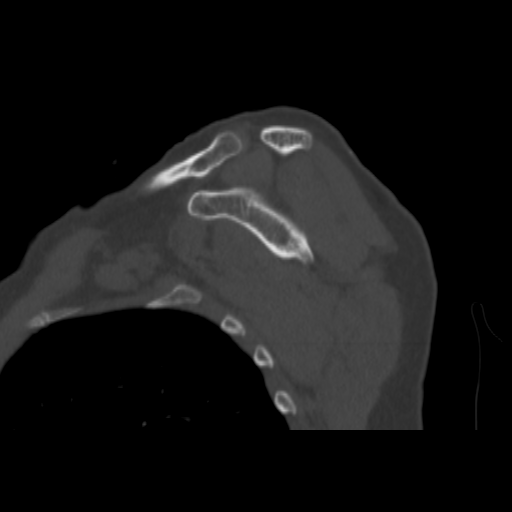

[15 of 33 positions shown; findings below may reference images not displayed]

FINDINGS: There appears to be a four-part fracture of the right humeral head
and neck, with approximately 1 cm of medial and superior
displacement of the lesser tuberosity fragment, approximately 1.5 cm
of superior displacement of the greater tuberosity fragment, and at
least 1.8 cm lateral displacement of the distal humerus. There is
underlying comminution of the humeral neck fracture, with displaced
fracture lines extending distally along the proximal humeral
diaphysis, and associated butterfly fragments.

The greater and lesser tuberosity fragments are relatively close to
each other, as both are superiorly displaced.

Surrounding soft tissue injury is noted, without a significant focal
hematoma. The rotator cuff is not well characterized. Postoperative
change is noted at the right axilla. The right acromioclavicular
joint is unremarkable in appearance. The visualized portions of the
right lung appear grossly clear.

The articular surface of the humeral head remains seated at the
glenoid fossa. The visualized right clavicle is unremarkable in
appearance.
IMPRESSION: Apparent 4 part fracture of the right humeral head and neck, with
approximately 1 cm medial and superior displacement of the lesser
tuberosity fragment, approximately 1.5 cm of superior displacement
of the greater tuberosity fragment, and at least 1.8 cm lateral
displacement of the distal humerus. Underlying comminution of the
humeral neck fracture, with displaced fracture lines extending
distally along the proximal humeral diaphysis. The greater and
lesser tuberosity fragments are relatively close to each other, as
both are superiorly displaced.
